# Patient Record
Sex: Female | Born: 1960 | Race: Black or African American | Hispanic: No | Marital: Married | Smoking: Never smoker
Health system: Southern US, Community
[De-identification: ages and names within clinical notes are randomized; demographics above are authoritative.]

## PROBLEM LIST (undated history)

## (undated) DIAGNOSIS — I10 Essential (primary) hypertension: Secondary | ICD-10-CM

## (undated) HISTORY — PX: HERNIA REPAIR: SHX51

---

## 2013-07-03 HISTORY — PX: TOE SURGERY: SHX1073

## 2014-12-18 ENCOUNTER — Other Ambulatory Visit: Payer: Self-pay

## 2014-12-18 DIAGNOSIS — Z1231 Encounter for screening mammogram for malignant neoplasm of breast: Secondary | ICD-10-CM

## 2014-12-24 ENCOUNTER — Ambulatory Visit
Admission: RE | Admit: 2014-12-24 | Discharge: 2014-12-24 | Disposition: A | Discharge status: Home / Self Care | Payer: BC Managed Care – PPO | Source: Ambulatory Visit

## 2014-12-24 ENCOUNTER — Encounter (INDEPENDENT_AMBULATORY_CARE_PROVIDER_SITE_OTHER): Payer: Self-pay

## 2014-12-24 DIAGNOSIS — Z1231 Encounter for screening mammogram for malignant neoplasm of breast: Secondary | ICD-10-CM

## 2015-12-20 ENCOUNTER — Other Ambulatory Visit: Payer: Self-pay | Admitting: Physician Assistant

## 2015-12-20 DIAGNOSIS — Z1231 Encounter for screening mammogram for malignant neoplasm of breast: Secondary | ICD-10-CM

## 2016-01-05 ENCOUNTER — Ambulatory Visit
Admission: RE | Admit: 2016-01-05 | Discharge: 2016-01-05 | Disposition: A | Discharge status: Home / Self Care | Payer: BC Managed Care – PPO | Source: Ambulatory Visit | Attending: Physician Assistant | Admitting: Physician Assistant

## 2016-01-05 DIAGNOSIS — Z1231 Encounter for screening mammogram for malignant neoplasm of breast: Secondary | ICD-10-CM

## 2016-06-23 ENCOUNTER — Other Ambulatory Visit: Payer: Self-pay | Admitting: Physician Assistant

## 2016-06-23 ENCOUNTER — Ambulatory Visit
Admission: RE | Admit: 2016-06-23 | Discharge: 2016-06-23 | Disposition: A | Discharge status: Home / Self Care | Payer: BC Managed Care – PPO | Source: Ambulatory Visit | Attending: Physician Assistant | Admitting: Physician Assistant

## 2016-06-23 DIAGNOSIS — M25561 Pain in right knee: Secondary | ICD-10-CM

## 2016-10-24 ENCOUNTER — Other Ambulatory Visit: Payer: Self-pay | Admitting: Physical Medicine and Rehabilitation

## 2016-10-24 ENCOUNTER — Ambulatory Visit
Admission: RE | Admit: 2016-10-24 | Discharge: 2016-10-24 | Disposition: A | Discharge status: Home / Self Care | Payer: BC Managed Care – PPO | Source: Ambulatory Visit | Attending: Physical Medicine and Rehabilitation | Admitting: Physical Medicine and Rehabilitation

## 2016-10-24 DIAGNOSIS — M25562 Pain in left knee: Secondary | ICD-10-CM

## 2017-01-15 ENCOUNTER — Other Ambulatory Visit: Payer: Self-pay | Admitting: Physician Assistant

## 2017-01-15 DIAGNOSIS — Z1231 Encounter for screening mammogram for malignant neoplasm of breast: Secondary | ICD-10-CM

## 2017-01-17 ENCOUNTER — Ambulatory Visit
Admission: RE | Admit: 2017-01-17 | Discharge: 2017-01-17 | Disposition: A | Discharge status: Home / Self Care | Payer: BC Managed Care – PPO | Source: Ambulatory Visit | Attending: Physician Assistant | Admitting: Physician Assistant

## 2017-01-17 DIAGNOSIS — Z1231 Encounter for screening mammogram for malignant neoplasm of breast: Secondary | ICD-10-CM

## 2017-07-27 ENCOUNTER — Other Ambulatory Visit (HOSPITAL_COMMUNITY)
Admission: RE | Admit: 2017-07-27 | Discharge: 2017-07-27 | Disposition: A | Discharge status: Home / Self Care | Payer: BC Managed Care – PPO | Source: Ambulatory Visit | Attending: Family Medicine | Admitting: Family Medicine

## 2017-07-27 ENCOUNTER — Other Ambulatory Visit: Payer: Self-pay | Admitting: Physician Assistant

## 2017-07-27 DIAGNOSIS — Z01419 Encounter for gynecological examination (general) (routine) without abnormal findings: Secondary | ICD-10-CM | POA: Insufficient documentation

## 2017-07-31 LAB — CYTOLOGY - PAP
Adequacy: ABSENT
DIAGNOSIS: NEGATIVE

## 2018-01-28 ENCOUNTER — Other Ambulatory Visit: Payer: Self-pay | Admitting: Physician Assistant

## 2018-01-28 DIAGNOSIS — Z1231 Encounter for screening mammogram for malignant neoplasm of breast: Secondary | ICD-10-CM

## 2018-02-25 ENCOUNTER — Ambulatory Visit
Admission: RE | Admit: 2018-02-25 | Discharge: 2018-02-25 | Disposition: A | Discharge status: Home / Self Care | Payer: BC Managed Care – PPO | Source: Ambulatory Visit | Attending: Physician Assistant | Admitting: Physician Assistant

## 2018-02-25 DIAGNOSIS — Z1231 Encounter for screening mammogram for malignant neoplasm of breast: Secondary | ICD-10-CM

## 2019-03-05 ENCOUNTER — Other Ambulatory Visit: Payer: Self-pay | Admitting: Physician Assistant

## 2019-03-05 DIAGNOSIS — Z1231 Encounter for screening mammogram for malignant neoplasm of breast: Secondary | ICD-10-CM

## 2019-04-22 ENCOUNTER — Other Ambulatory Visit: Payer: Self-pay

## 2019-04-22 ENCOUNTER — Ambulatory Visit
Admission: RE | Admit: 2019-04-22 | Discharge: 2019-04-22 | Disposition: A | Discharge status: Home / Self Care | Payer: BC Managed Care – PPO | Source: Ambulatory Visit | Attending: Physician Assistant | Admitting: Physician Assistant

## 2019-04-22 DIAGNOSIS — Z1231 Encounter for screening mammogram for malignant neoplasm of breast: Secondary | ICD-10-CM

## 2019-07-14 ENCOUNTER — Ambulatory Visit: Payer: BC Managed Care – PPO | Attending: Internal Medicine

## 2019-07-14 DIAGNOSIS — Z20822 Contact with and (suspected) exposure to covid-19: Secondary | ICD-10-CM

## 2019-07-15 LAB — NOVEL CORONAVIRUS, NAA: SARS-CoV-2, NAA: NOT DETECTED

## 2020-04-26 ENCOUNTER — Other Ambulatory Visit: Payer: Self-pay | Admitting: Physician Assistant

## 2020-04-26 DIAGNOSIS — Z0000 Encounter for general adult medical examination without abnormal findings: Secondary | ICD-10-CM

## 2020-05-26 ENCOUNTER — Other Ambulatory Visit: Payer: Self-pay

## 2020-05-26 ENCOUNTER — Ambulatory Visit
Admission: RE | Admit: 2020-05-26 | Discharge: 2020-05-26 | Disposition: A | Discharge status: Home / Self Care | Payer: BC Managed Care – PPO | Source: Ambulatory Visit | Attending: Physician Assistant | Admitting: Physician Assistant

## 2020-05-26 DIAGNOSIS — Z0000 Encounter for general adult medical examination without abnormal findings: Secondary | ICD-10-CM

## 2020-09-21 ENCOUNTER — Other Ambulatory Visit: Payer: Self-pay | Admitting: Physician Assistant

## 2020-09-21 ENCOUNTER — Ambulatory Visit
Admission: RE | Admit: 2020-09-21 | Discharge: 2020-09-21 | Disposition: A | Discharge status: Home / Self Care | Payer: BC Managed Care – PPO | Source: Ambulatory Visit | Attending: Physician Assistant | Admitting: Physician Assistant

## 2020-09-21 DIAGNOSIS — M25561 Pain in right knee: Secondary | ICD-10-CM

## 2020-09-21 DIAGNOSIS — M25562 Pain in left knee: Secondary | ICD-10-CM

## 2020-11-23 ENCOUNTER — Other Ambulatory Visit: Payer: Self-pay | Admitting: Student

## 2020-11-23 DIAGNOSIS — M25561 Pain in right knee: Secondary | ICD-10-CM

## 2020-12-07 ENCOUNTER — Ambulatory Visit
Admission: RE | Admit: 2020-12-07 | Discharge: 2020-12-07 | Disposition: A | Discharge status: Home / Self Care | Payer: BC Managed Care – PPO | Source: Ambulatory Visit | Attending: Student | Admitting: Student

## 2020-12-07 ENCOUNTER — Other Ambulatory Visit: Payer: Self-pay

## 2020-12-07 DIAGNOSIS — M25561 Pain in right knee: Secondary | ICD-10-CM

## 2021-01-13 ENCOUNTER — Other Ambulatory Visit: Payer: Self-pay | Admitting: Internal Medicine

## 2021-01-13 DIAGNOSIS — N950 Postmenopausal bleeding: Secondary | ICD-10-CM

## 2021-01-17 ENCOUNTER — Ambulatory Visit
Admission: RE | Admit: 2021-01-17 | Discharge: 2021-01-17 | Disposition: A | Discharge status: Home / Self Care | Payer: BC Managed Care – PPO | Source: Ambulatory Visit | Attending: Internal Medicine | Admitting: Internal Medicine

## 2021-01-17 DIAGNOSIS — N950 Postmenopausal bleeding: Secondary | ICD-10-CM

## 2022-01-11 IMAGING — US US PELVIS COMPLETE WITH TRANSVAGINAL
1 series · 13 of 25 positions shown · non-contrast
Comparison: None

CLINICAL DATA: Postmenopausal bleeding



[Series 1: us pelvis complete with transvaginal · 0.22mm/px · 13 of 67 slices shown]
[im 1/67]
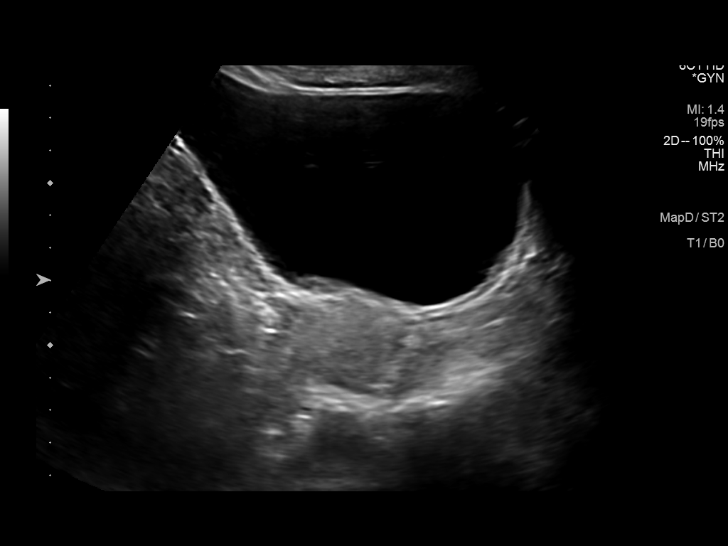
[im 6/67]
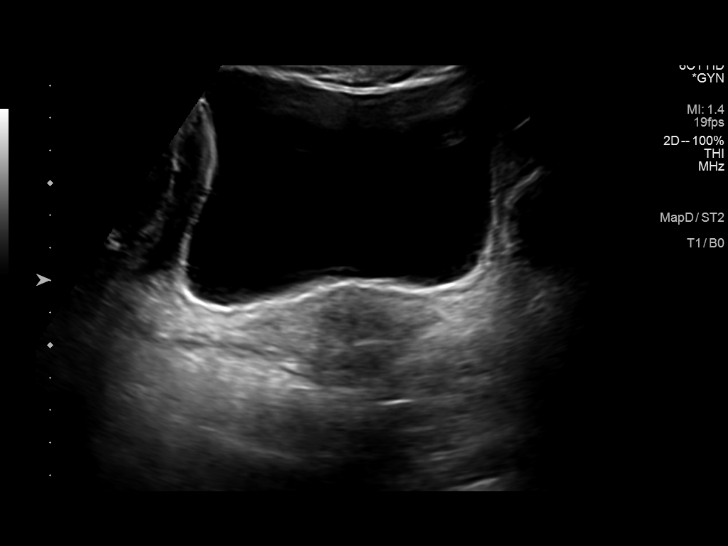
[im 12/67]
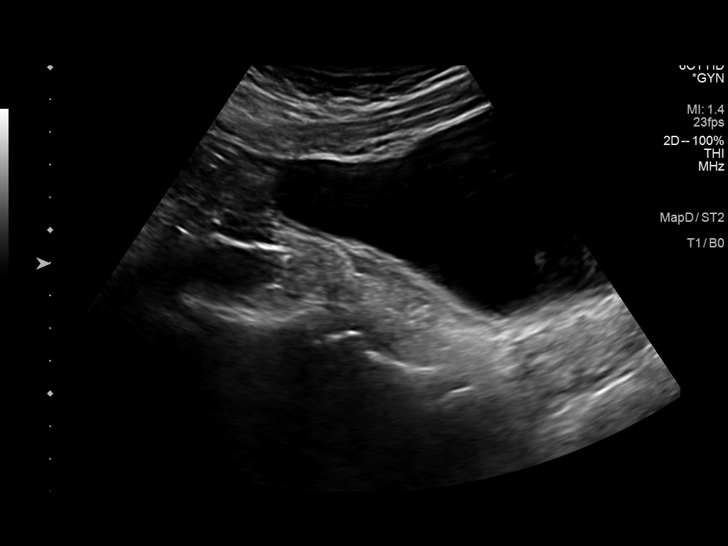
[im 17/67]
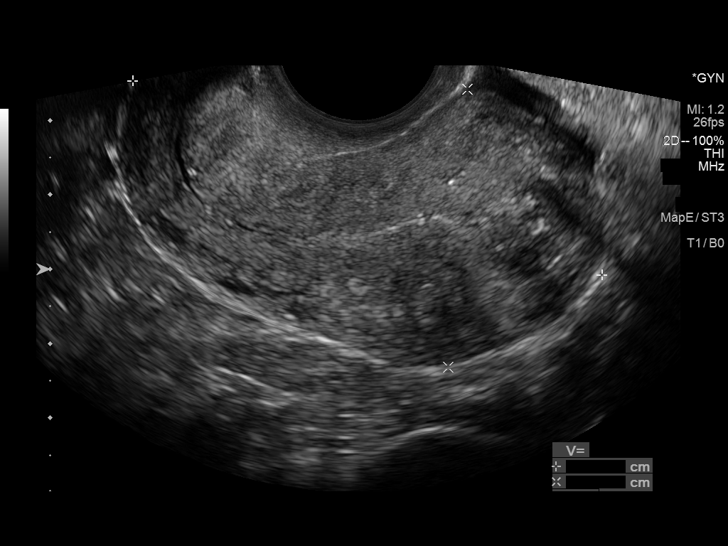
[im 23/67]
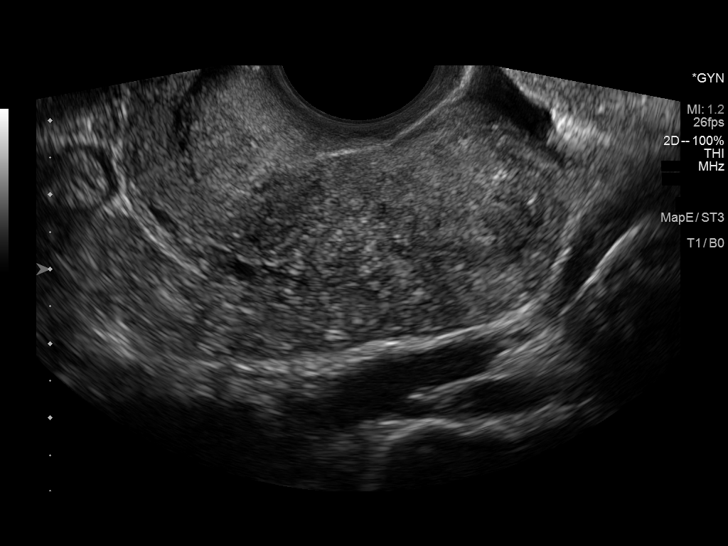
[im 28/67]
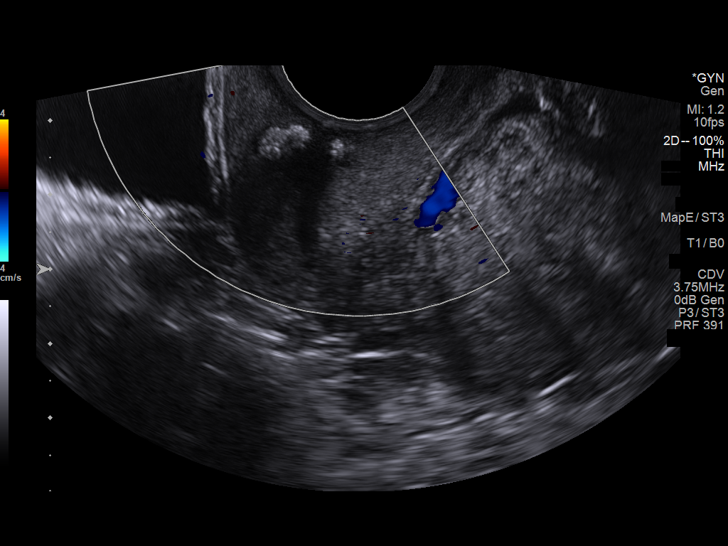
[im 34/67]
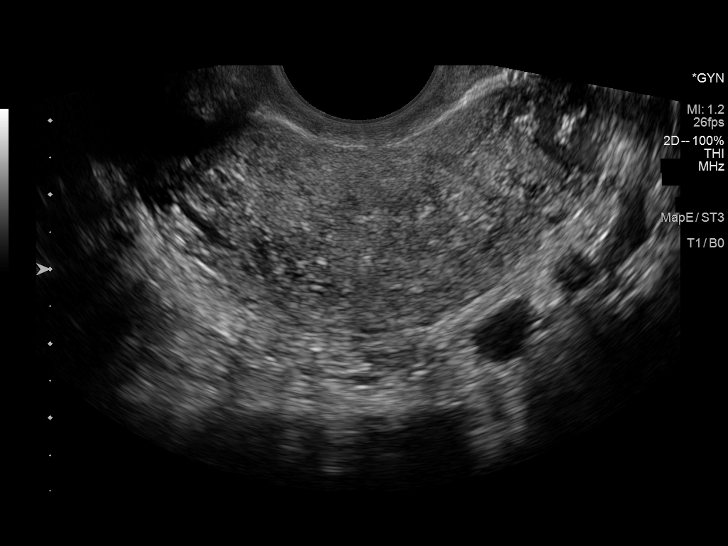
[im 39/67]
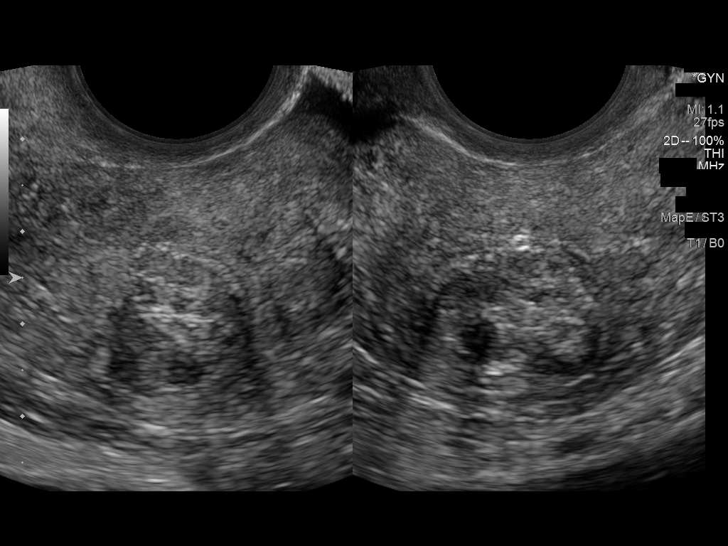
[im 45/67]
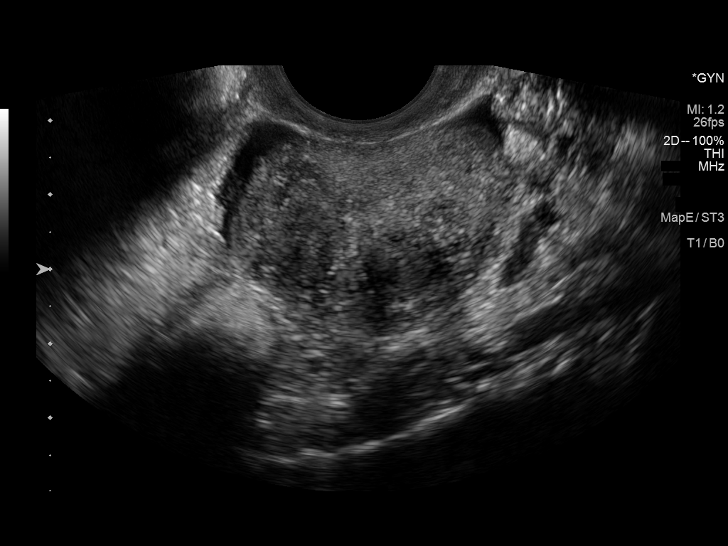
[im 50/67]
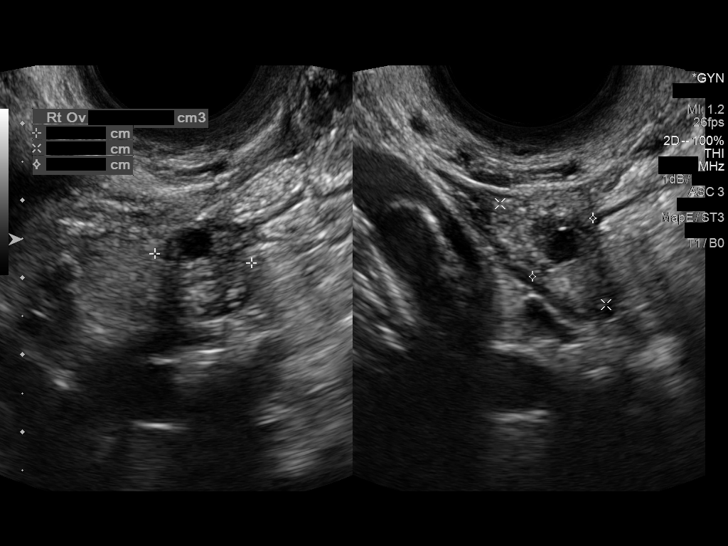
[im 56/67]
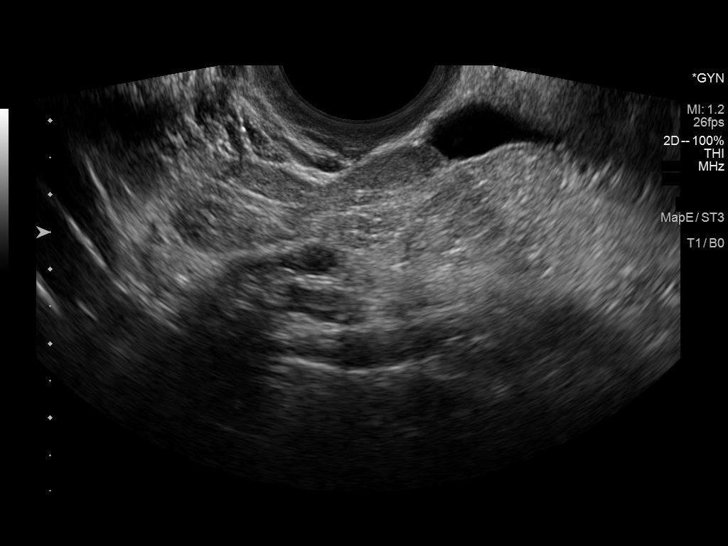
[im 61/67]
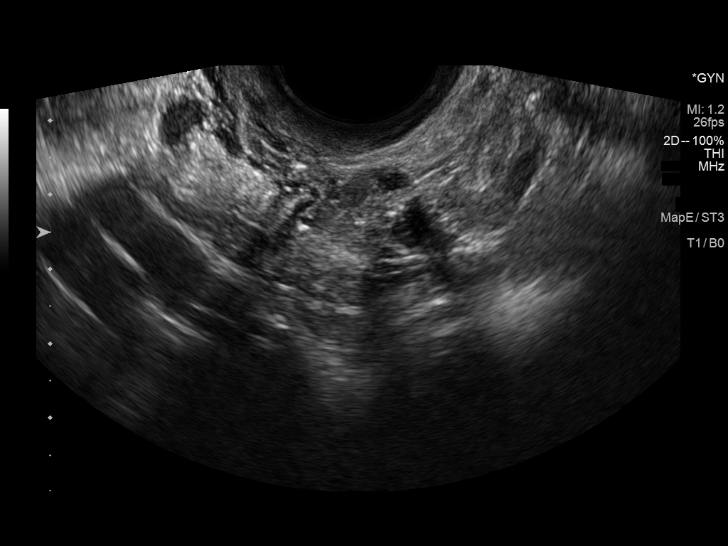
[im 67/67]
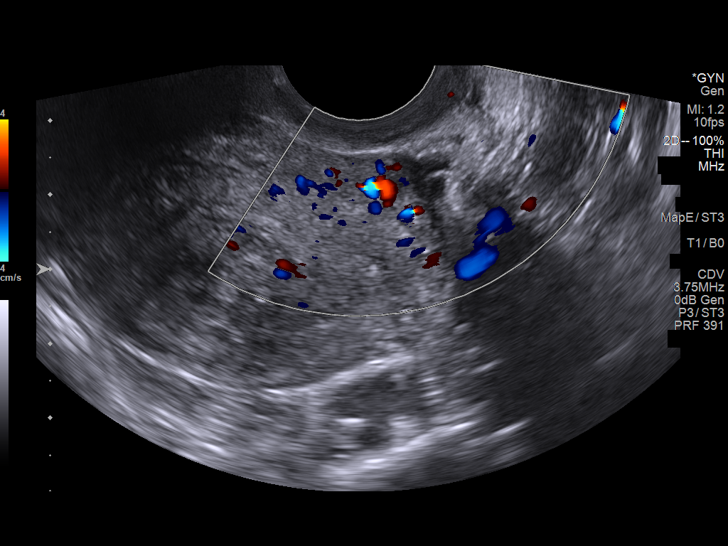

[13 of 25 positions shown; findings below may reference images not displayed]

FINDINGS: Uterus

Measurements: 6.8 x 3.7 x 4.7 cm = volume: 62 mL. Retroverted.
Normal morphology with small intramural leiomyoma at anterior upper
uterus 19 x 15 x 19 mm. Additional small fundal leiomyoma intramural
9 x 11 x 15 mm.

Endometrium

Thickness: 2 mm.  No endometrial fluid or focal abnormality

Right ovary

Measurements: 1.3 x 1.9 x 1.1 cm = volume: 1.4 mL. Normal morphology
without mass

Left ovary

Measurements: 1.8 x 1.4 x 1.1 cm = volume: 1.5 mL. Normal morphology
without mass

Other findings

No free pelvic fluid.  No adnexal masses.
IMPRESSION: Two small uterine leiomyomata.

Otherwise normal exam.

Specifically, endometrial complex normal in appearance, 2 mm thick;
in the setting of post-menopausal bleeding, this is consistent with
a benign etiology such as endometrial atrophy. If bleeding remains
unresponsive to hormonal or medical therapy, sonohysterogram should
be considered for focal lesion work-up. (Ref: Radiological
Reasoning: Algorithmic Workup of Abnormal Vaginal Bleeding with
Endovaginal Sonography and Sonohysterography. AJR [id number]; 191:S68-73)

## 2022-01-17 ENCOUNTER — Other Ambulatory Visit: Payer: Self-pay | Admitting: Physician Assistant

## 2022-01-17 DIAGNOSIS — Z1231 Encounter for screening mammogram for malignant neoplasm of breast: Secondary | ICD-10-CM

## 2022-02-23 ENCOUNTER — Other Ambulatory Visit (HOSPITAL_COMMUNITY): Payer: Self-pay

## 2022-02-23 MED ORDER — SAXENDA 18 MG/3ML ~~LOC~~ SOPN
3.0000 mg | PEN_INJECTOR | Freq: Every day | SUBCUTANEOUS | 1 refills | Status: DC
  Filled 2022-02-23: qty 15, 30d supply, fill #0
  Filled 2022-03-21: qty 15, 30d supply, fill #1

## 2022-02-24 ENCOUNTER — Ambulatory Visit
Admission: RE | Admit: 2022-02-24 | Discharge: 2022-02-24 | Disposition: A | Discharge status: Home / Self Care | Payer: BC Managed Care – PPO | Source: Ambulatory Visit | Attending: Physician Assistant | Admitting: Physician Assistant

## 2022-02-24 ENCOUNTER — Other Ambulatory Visit (HOSPITAL_COMMUNITY): Payer: Self-pay

## 2022-02-24 DIAGNOSIS — Z1231 Encounter for screening mammogram for malignant neoplasm of breast: Secondary | ICD-10-CM

## 2022-03-21 ENCOUNTER — Other Ambulatory Visit (HOSPITAL_COMMUNITY): Payer: Self-pay

## 2022-03-30 ENCOUNTER — Other Ambulatory Visit (HOSPITAL_COMMUNITY): Payer: Self-pay

## 2022-03-30 MED ORDER — INSULIN PEN NEEDLE 32G X 4 MM MISC
Freq: Every day | 0 refills | Status: DC
  Filled 2022-03-30: qty 100, 100d supply, fill #0

## 2022-03-30 MED ORDER — SAXENDA 18 MG/3ML ~~LOC~~ SOPN
3.0000 mg | PEN_INJECTOR | Freq: Every day | SUBCUTANEOUS | 1 refills | Status: DC
  Filled 2022-03-30: qty 15, 30d supply, fill #0

## 2022-04-08 ENCOUNTER — Emergency Department (HOSPITAL_BASED_OUTPATIENT_CLINIC_OR_DEPARTMENT_OTHER)
Admission: EM | Admit: 2022-04-08 | Discharge: 2022-04-08 | Disposition: A | Discharge status: Home / Self Care | Payer: BC Managed Care – PPO | Attending: Emergency Medicine | Admitting: Emergency Medicine

## 2022-04-08 ENCOUNTER — Encounter (HOSPITAL_BASED_OUTPATIENT_CLINIC_OR_DEPARTMENT_OTHER): Payer: Self-pay

## 2022-04-08 ENCOUNTER — Emergency Department (HOSPITAL_BASED_OUTPATIENT_CLINIC_OR_DEPARTMENT_OTHER): Payer: BC Managed Care – PPO

## 2022-04-08 ENCOUNTER — Other Ambulatory Visit: Payer: Self-pay

## 2022-04-08 DIAGNOSIS — Z794 Long term (current) use of insulin: Secondary | ICD-10-CM | POA: Diagnosis not present

## 2022-04-08 DIAGNOSIS — K625 Hemorrhage of anus and rectum: Secondary | ICD-10-CM

## 2022-04-08 DIAGNOSIS — R1030 Lower abdominal pain, unspecified: Secondary | ICD-10-CM

## 2022-04-08 HISTORY — DX: Essential (primary) hypertension: I10

## 2022-04-08 LAB — URINALYSIS, ROUTINE W REFLEX MICROSCOPIC
Bilirubin Urine: NEGATIVE
Glucose, UA: NEGATIVE mg/dL
Ketones, ur: NEGATIVE mg/dL
Leukocytes,Ua: NEGATIVE
Nitrite: NEGATIVE
Specific Gravity, Urine: 1.022 (ref 1.005–1.030)
pH: 6 (ref 5.0–8.0)

## 2022-04-08 LAB — COMPREHENSIVE METABOLIC PANEL
ALT: 16 U/L (ref 0–44)
AST: 11 U/L — ABNORMAL LOW (ref 15–41)
Albumin: 4.4 g/dL (ref 3.5–5.0)
Alkaline Phosphatase: 71 U/L (ref 38–126)
Anion gap: 11 (ref 5–15)
BUN: 14 mg/dL (ref 6–20)
CO2: 27 mmol/L (ref 22–32)
Calcium: 9.9 mg/dL (ref 8.9–10.3)
Chloride: 101 mmol/L (ref 98–111)
Creatinine, Ser: 0.97 mg/dL (ref 0.44–1.00)
GFR, Estimated: >60 mL/min (ref >60)
Glucose, Bld: 80 mg/dL (ref 70–99)
Potassium: 3.4 mmol/L — ABNORMAL LOW (ref 3.5–5.1)
Sodium: 139 mmol/L (ref 135–145)
Total Bilirubin: 1 mg/dL (ref 0.3–1.2)
Total Protein: 8.9 g/dL — ABNORMAL HIGH (ref 6.5–8.1)

## 2022-04-08 LAB — CBC WITH DIFFERENTIAL/PLATELET
Abs Immature Granulocytes: 0.03 10*3/uL (ref 0.00–0.07)
Basophils Absolute: 0 10*3/uL (ref 0.0–0.1)
Basophils Relative: 0 %
Eosinophils Absolute: 0 10*3/uL (ref 0.0–0.5)
Eosinophils Relative: 0 %
HCT: 37.2 % (ref 36.0–46.0)
Hemoglobin: 12.8 g/dL (ref 12.0–15.0)
Immature Granulocytes: 0 %
Lymphocytes Relative: 29 %
Lymphs Abs: 2.6 10*3/uL (ref 0.7–4.0)
MCH: 29.4 pg (ref 26.0–34.0)
MCHC: 34.4 g/dL (ref 30.0–36.0)
MCV: 85.5 fL (ref 80.0–100.0)
Monocytes Absolute: 0.5 10*3/uL (ref 0.1–1.0)
Monocytes Relative: 6 %
Neutro Abs: 5.9 10*3/uL (ref 1.7–7.7)
Neutrophils Relative %: 65 %
Platelets: 328 10*3/uL (ref 150–400)
RBC: 4.35 MIL/uL (ref 3.87–5.11)
RDW: 13.2 % (ref 11.5–15.5)
WBC: 9.1 10*3/uL (ref 4.0–10.5)
nRBC: 0 % (ref 0.0–0.2)

## 2022-04-08 LAB — LIPASE, BLOOD: Lipase: 57 U/L — ABNORMAL HIGH (ref 11–51)

## 2022-04-08 MED ORDER — SODIUM CHLORIDE 0.9 % IV BOLUS
1000.0000 mL | Freq: Once | INTRAVENOUS | Status: AC
  Administered 2022-04-08: 1000 mL via INTRAVENOUS

## 2022-04-08 MED ORDER — HYDROCORTISONE (PERIANAL) 2.5 % EX CREA: 1.0000 | TOPICAL_CREAM | Freq: Two times a day (BID) | CUTANEOUS | 0 refills | Status: DC

## 2022-04-08 MED ORDER — ONDANSETRON HCL 4 MG/2ML IJ SOLN
4.0000 mg | Freq: Once | INTRAMUSCULAR | Status: AC
  Administered 2022-04-08: 4 mg via INTRAVENOUS
  Filled 2022-04-08: qty 2

## 2022-04-08 MED ORDER — ONDANSETRON 4 MG PO TBDP: 4.0000 mg | ORAL_TABLET | Freq: Three times a day (TID) | ORAL | 0 refills | Status: DC | PRN

## 2022-04-08 MED ORDER — MORPHINE SULFATE (PF) 4 MG/ML IV SOLN
4.0000 mg | Freq: Once | INTRAVENOUS | Status: AC
  Administered 2022-04-08: 4 mg via INTRAVENOUS
  Filled 2022-04-08: qty 1

## 2022-04-08 MED ORDER — HYDROCODONE-ACETAMINOPHEN 5-325 MG PO TABS: 1.0000 | ORAL_TABLET | ORAL | 0 refills | Status: DC | PRN

## 2022-04-08 MED ORDER — IOHEXOL 300 MG/ML  SOLN
100.0000 mL | Freq: Once | INTRAMUSCULAR | Status: AC | PRN
  Administered 2022-04-08: 100 mL via INTRAVENOUS

## 2022-04-08 NOTE — ED Provider Notes (Signed)
[place] EMERGENCY DEPT Provider Note   CSN: [id number] Arrival date & time: 04/08/22  1341     History  Chief Complaint  Patient presents with   Abdominal Pain    Meghan Ramirez is a 61 y.o. female.  Pt is a 61 yo female with pmhx significant for htn and arthritis.  Pt said she woke up yesterday morning around 0400 with abd pain.  She felt like she had to have a bowel movement and just blood came out.  Pt has continued having lower abd pain as well as rectal bleeding.  She went to [facility] and was sent here for further eval.  Pt had a colonoscopy a few years ago with Dr. Collene Mares.        Home Medications Prior to Admission medications   Medication Sig Start Date End Date Taking? Authorizing Provider  HYDROcodone-acetaminophen (NORCO/VICODIN) 5-325 MG tablet Take 1 tablet by mouth every 4 (four) hours as needed. 04/08/22  Yes Isla Pence, MD  hydrocortisone (ANUSOL-HC) 2.5 % rectal cream Place 1 Application rectally 2 (two) times daily. 04/08/22  Yes Isla Pence, MD  ondansetron (ZOFRAN-ODT) 4 MG disintegrating tablet Take 1 tablet (4 mg total) by mouth every 8 (eight) hours as needed for nausea or vomiting. 04/08/22  Yes Isla Pence, MD  Insulin Pen Needle 32G X 4 MM MISC Use 1 pen needle to inject Saxenda daily. 03/30/22     Liraglutide -Weight Management (SAXENDA) 18 MG/3ML SOPN Inject 3 mg into the skin daily. 03/30/22         Allergies    Patient has no known allergies.    Review of Systems   Review of Systems  Gastrointestinal:  Positive for abdominal pain and anal bleeding.  All other systems reviewed and are negative.   Physical Exam Updated Vital Signs BP (!) 149/86 (BP Location: Right Arm)   Pulse 76   Temp 98.2 F (36.8 C) (Oral)   Resp 18   Ht 5\' 4"  (1.626 m)   Wt 88.5 kg   SpO2 97%   BMI 33.47 kg/m  Physical Exam Vitals and nursing note reviewed.  Constitutional:      Appearance: She is well-developed. She is obese.  HENT:      Head: Normocephalic and atraumatic.     Mouth/Throat:     Mouth: Mucous membranes are moist.     Pharynx: Oropharynx is clear.  Eyes:     Extraocular Movements: Extraocular movements intact.     Pupils: Pupils are equal, round, and reactive to light.  Cardiovascular:     Rate and Rhythm: Normal rate and regular rhythm.     Heart sounds: Normal heart sounds.  Pulmonary:     Effort: Pulmonary effort is normal.     Breath sounds: Normal breath sounds.  Abdominal:     General: Abdomen is flat and protuberant. Bowel sounds are normal.     Palpations: Abdomen is soft.     Tenderness: There is abdominal tenderness in the right lower quadrant and left lower quadrant.  Skin:    General: Skin is warm.     Capillary Refill: Capillary refill takes less than 2 seconds.  Neurological:     General: No focal deficit present.     Mental Status: She is alert and oriented to person, place, and time.  Psychiatric:        Mood and Affect: Mood normal.        Behavior: Behavior normal.     ED Results /  Procedures / Treatments   Labs (all labs ordered are listed, but only abnormal results are displayed) Labs Reviewed  COMPREHENSIVE METABOLIC PANEL - Abnormal; Notable for the following components:      Result Value   Potassium 3.4 (*)    Total Protein 8.9 (*)    AST 11 (*)    All other components within normal limits  LIPASE, BLOOD - Abnormal; Notable for the following components:   Lipase 57 (*)    All other components within normal limits  URINALYSIS, ROUTINE W REFLEX MICROSCOPIC - Abnormal; Notable for the following components:   Hgb urine dipstick TRACE (*)    Protein, ur TRACE (*)    All other components within normal limits  CBC WITH DIFFERENTIAL/PLATELET    EKG None  Radiology CT ABDOMEN PELVIS W CONTRAST  Result Date: 04/08/2022 CLINICAL DATA:  Abdominal pain. EXAM: CT ABDOMEN AND PELVIS WITH CONTRAST TECHNIQUE: Multidetector CT imaging of the abdomen and pelvis was performed  using the standard protocol following bolus administration of intravenous contrast. RADIATION DOSE REDUCTION: This exam was performed according to the departmental dose-optimization program which includes automated exposure control, adjustment of the mA and/or kV according to patient size and/or use of iterative reconstruction technique. CONTRAST:  OMNIPAQUE IOHEXOL 300 MG/ML  SOLN COMPARISON:  None Available. FINDINGS: Lower chest: Clear lung bases. Hepatobiliary: No focal liver abnormality is seen. No gallstones, gallbladder wall thickening, or biliary dilatation. Pancreas: Unremarkable. No pancreatic ductal dilatation or surrounding inflammatory changes. Spleen: Normal size. Small, 9 mm, cyst, inferior margin. No other masses or lesions. Adrenals/Urinary Tract: Normal adrenal glands. Kidneys normal in size, orientation position with symmetric enhancement and excretion. Left upper pole cyst, 1.2 cm. Right midpole cyst, 1 cm. No follow-up recommended. No other renal masses, no stones and no hydronephrosis. Normal ureters. Normal bladder. Stomach/Bowel: Normal stomach. Small bowel and colon are normal in caliber. No wall thickening. No inflammation. No evidence of appendicitis. Vascular/Lymphatic: No significant vascular findings are present. No enlarged abdominal or pelvic lymph nodes. Reproductive: Uterus and bilateral adnexa are unremarkable. Other: Small fat containing paraumbilical hernia.  No ascites. Musculoskeletal: No fracture or acute finding. No bone lesion. Disc and facet degenerative changes at L4-L5 with a grade 1 anterolisthesis. IMPRESSION: 1. No acute findings within the abdomen or pelvis. No findings to account for abdominal pain. Electronically Signed   By: Amie Portland M.D.   On: 04/08/2022 18:35    Procedures Procedures    Medications Ordered in ED Medications  sodium chloride 0.9 % bolus 1,000 mL (1,000 mLs Intravenous New Bag/Given 04/08/22 1759)  morphine (PF) 4 MG/ML injection  4 mg (4 mg Intravenous Given 04/08/22 1805)  ondansetron (ZOFRAN) injection 4 mg (4 mg Intravenous Given 04/08/22 1805)  iohexol (OMNIPAQUE) 300 MG/ML solution 100 mL (100 mLs Intravenous Contrast Given 04/08/22 1809)    ED Course/ Medical Decision Making/ A&P                           Medical Decision Making Amount and/or Complexity of Data Reviewed Labs: ordered. Radiology: ordered.  Risk Prescription drug management.   This patient presents to the ED for concern of abd pain, this involves an extensive number of treatment options, and is a complaint that carries with it a high risk of complications and morbidity.  The differential diagnosis includes diverticulitis, appendicitis, gastritis, diverticulosis, uti   Co morbidities that complicate the patient evaluation  Htn, OA   Additional history  obtained:  Additional history obtained from epic chart review   Lab Tests:  I Ordered, and personally interpreted labs.  The pertinent results include:  cbc nl, cmp nl, lip nl; ua with some trace blood (likely rectal source)   Imaging Studies ordered:  I ordered imaging studies including ct abd/pelvis  I independently visualized and interpreted imaging which showed  IMPRESSION:  1. No acute findings within the abdomen or pelvis. No findings to  account for abdominal pain.   I agree with the radiologist interpretation   Cardiac Monitoring:  The patient was maintained on a cardiac monitor.  I personally viewed and interpreted the cardiac monitored which showed an underlying rhythm of: nsr   Medicines ordered and prescription drug management:  I ordered medication including morphine and zofran  for pain/nausea  Reevaluation of the patient after these medicines showed that the patient improved I have reviewed the patients home medicines and have made adjustments as needed   Test Considered:  ct   Critical Interventions:  Pain control     Problem List / ED  Course:  Abd pain with Rectal Bleeding:  CT nl.  Labs nl.  Vitals nl.  Rectal bleeding likely from internal hemorrhoid.  Pt is d/c with anusol and instructed to f/u with Dr. Loreta Ave.  Pt knows to return if worse.    Reevaluation:  After the interventions noted above, I reevaluated the patient and found that they have :improved   Social Determinants of Health:  Lives at home   Dispostion:  After consideration of the diagnostic results and the patients response to treatment, I feel that the patent would benefit from discharge with outpatient f/u.          Final Clinical Impression(s) / ED Diagnoses Final diagnoses:  Lower abdominal pain  Rectal bleeding    Rx / DC Orders ED Discharge Orders          Ordered    hydrocortisone (ANUSOL-HC) 2.5 % rectal cream  2 times daily        04/08/22 1908    HYDROcodone-acetaminophen (NORCO/VICODIN) 5-325 MG tablet  Every 4 hours PRN        04/08/22 1908    ondansetron (ZOFRAN-ODT) 4 MG disintegrating tablet  Every 8 hours PRN        04/08/22 1908              Jacalyn Lefevre, MD 04/08/22 1909

## 2022-04-08 NOTE — ED Triage Notes (Signed)
Pt arrives POV from home with c/o of lower abdominal pain, described as severe cramping.  Pain started yesterday around 4:00 am.  This morning she noted blood in toilet when she had a bowel movement.  She was seen at [facility] walk in clinic this am, rectal exam and abdominal x-rays performed, no source of bleeding found.  Pt was sent to ER for further evaluation/CT scan.  Pt ambulatory to triage, in NAD.

## 2022-05-30 ENCOUNTER — Other Ambulatory Visit (HOSPITAL_COMMUNITY): Payer: Self-pay

## 2022-11-09 ENCOUNTER — Other Ambulatory Visit (HOSPITAL_COMMUNITY): Payer: Self-pay

## 2023-03-27 ENCOUNTER — Other Ambulatory Visit: Payer: Self-pay | Admitting: Physician Assistant

## 2023-03-27 DIAGNOSIS — Z1231 Encounter for screening mammogram for malignant neoplasm of breast: Secondary | ICD-10-CM

## 2023-03-28 ENCOUNTER — Ambulatory Visit: Payer: BC Managed Care – PPO

## 2023-05-17 ENCOUNTER — Ambulatory Visit
Admission: RE | Admit: 2023-05-17 | Discharge: 2023-05-17 | Disposition: A | Discharge status: Home / Self Care | Payer: BC Managed Care – PPO | Source: Ambulatory Visit | Attending: Physician Assistant | Admitting: Physician Assistant

## 2023-05-17 DIAGNOSIS — Z1231 Encounter for screening mammogram for malignant neoplasm of breast: Secondary | ICD-10-CM

## 2023-05-18 ENCOUNTER — Other Ambulatory Visit: Payer: Self-pay | Admitting: Physician Assistant

## 2023-05-18 DIAGNOSIS — N644 Mastodynia: Secondary | ICD-10-CM

## 2023-05-18 DIAGNOSIS — N6452 Nipple discharge: Secondary | ICD-10-CM

## 2023-06-06 ENCOUNTER — Other Ambulatory Visit: Payer: Self-pay | Admitting: Physician Assistant

## 2023-06-06 ENCOUNTER — Ambulatory Visit
Admission: RE | Admit: 2023-06-06 | Discharge: 2023-06-06 | Disposition: A | Discharge status: Home / Self Care | Payer: BC Managed Care – PPO | Source: Ambulatory Visit | Attending: Physician Assistant | Admitting: Physician Assistant

## 2023-06-06 DIAGNOSIS — N6452 Nipple discharge: Secondary | ICD-10-CM

## 2023-06-06 DIAGNOSIS — N644 Mastodynia: Secondary | ICD-10-CM

## 2023-06-06 DIAGNOSIS — N63 Unspecified lump in unspecified breast: Secondary | ICD-10-CM

## 2023-06-07 ENCOUNTER — Ambulatory Visit
Admission: RE | Admit: 2023-06-07 | Discharge: 2023-06-07 | Disposition: A | Discharge status: Home / Self Care | Payer: BC Managed Care – PPO | Source: Ambulatory Visit | Attending: Physician Assistant | Admitting: Physician Assistant

## 2023-06-07 DIAGNOSIS — N63 Unspecified lump in unspecified breast: Secondary | ICD-10-CM

## 2023-06-07 DIAGNOSIS — N6452 Nipple discharge: Secondary | ICD-10-CM

## 2023-06-13 ENCOUNTER — Other Ambulatory Visit: Payer: Self-pay | Admitting: Physician Assistant

## 2023-06-13 DIAGNOSIS — N6452 Nipple discharge: Secondary | ICD-10-CM

## 2023-06-14 ENCOUNTER — Ambulatory Visit
Admission: RE | Admit: 2023-06-14 | Discharge: 2023-06-14 | Disposition: A | Discharge status: Home / Self Care | Payer: BC Managed Care – PPO | Source: Ambulatory Visit | Attending: Physician Assistant | Admitting: Physician Assistant

## 2023-06-14 DIAGNOSIS — N6452 Nipple discharge: Secondary | ICD-10-CM

## 2023-06-14 MED ORDER — GADOPICLENOL 0.5 MMOL/ML IV SOLN
8.0000 mL | Freq: Once | INTRAVENOUS | Status: AC | PRN
  Administered 2023-06-14: 8 mL via INTRAVENOUS

## 2023-07-02 ENCOUNTER — Emergency Department (HOSPITAL_BASED_OUTPATIENT_CLINIC_OR_DEPARTMENT_OTHER): Payer: BC Managed Care – PPO | Admitting: Radiology

## 2023-07-02 ENCOUNTER — Ambulatory Visit
Admission: EM | Admit: 2023-07-02 | Discharge: 2023-07-02 | Disposition: A | Discharge status: Home / Self Care | Payer: BC Managed Care – PPO | Attending: Family Medicine | Admitting: Family Medicine

## 2023-07-02 ENCOUNTER — Emergency Department (HOSPITAL_BASED_OUTPATIENT_CLINIC_OR_DEPARTMENT_OTHER)
Admission: EM | Admit: 2023-07-02 | Discharge: 2023-07-02 | Disposition: A | Discharge status: Home / Self Care | Payer: BC Managed Care – PPO | Attending: Emergency Medicine | Admitting: Emergency Medicine

## 2023-07-02 ENCOUNTER — Encounter (HOSPITAL_BASED_OUTPATIENT_CLINIC_OR_DEPARTMENT_OTHER): Payer: Self-pay | Admitting: Emergency Medicine

## 2023-07-02 ENCOUNTER — Other Ambulatory Visit: Payer: Self-pay

## 2023-07-02 DIAGNOSIS — I10 Essential (primary) hypertension: Secondary | ICD-10-CM | POA: Diagnosis not present

## 2023-07-02 DIAGNOSIS — R0789 Other chest pain: Secondary | ICD-10-CM | POA: Diagnosis not present

## 2023-07-02 DIAGNOSIS — R0602 Shortness of breath: Secondary | ICD-10-CM | POA: Insufficient documentation

## 2023-07-02 DIAGNOSIS — Z79899 Other long term (current) drug therapy: Secondary | ICD-10-CM | POA: Insufficient documentation

## 2023-07-02 DIAGNOSIS — R079 Chest pain, unspecified: Secondary | ICD-10-CM | POA: Insufficient documentation

## 2023-07-02 DIAGNOSIS — I160 Hypertensive urgency: Secondary | ICD-10-CM

## 2023-07-02 LAB — BASIC METABOLIC PANEL
Anion gap: 12 (ref 5–15)
BUN: 19 mg/dL (ref 6–20)
CO2: 24 mmol/L (ref 22–32)
Calcium: 9.7 mg/dL (ref 8.9–10.3)
Chloride: 102 mmol/L (ref 98–111)
Creatinine, Ser: 0.77 mg/dL (ref 0.44–1.00)
GFR, Estimated: >60 mL/min (ref >60)
Glucose, Bld: 95 mg/dL (ref 70–99)
Potassium: 3.3 mmol/L — ABNORMAL LOW (ref 3.5–5.1)
Sodium: 138 mmol/L (ref 135–145)

## 2023-07-02 LAB — CBC
HCT: 35.2 % — ABNORMAL LOW (ref 36.0–46.0)
Hemoglobin: 12.4 g/dL (ref 12.0–15.0)
MCH: 29.9 pg (ref 26.0–34.0)
MCHC: 35.2 g/dL (ref 30.0–36.0)
MCV: 84.8 fL (ref 80.0–100.0)
Platelets: 326 10*3/uL (ref 150–400)
RBC: 4.15 MIL/uL (ref 3.87–5.11)
RDW: 13.8 % (ref 11.5–15.5)
WBC: 9.2 10*3/uL (ref 4.0–10.5)
nRBC: 0 % (ref 0.0–0.2)

## 2023-07-02 LAB — TROPONIN I (HIGH SENSITIVITY)
Troponin I (High Sensitivity): 3 ng/L (ref <18)
Troponin I (High Sensitivity): 3 ng/L (ref <18)

## 2023-07-02 LAB — D-DIMER, QUANTITATIVE: D-Dimer, Quant: 0.33 ug{FEU}/mL (ref 0.00–0.50)

## 2023-07-02 MED ORDER — POTASSIUM CHLORIDE CRYS ER 20 MEQ PO TBCR
40.0000 meq | EXTENDED_RELEASE_TABLET | Freq: Once | ORAL | Status: AC
  Administered 2023-07-02: 40 meq via ORAL
  Filled 2023-07-02: qty 2

## 2023-07-02 MED ORDER — ASPIRIN 81 MG PO CHEW
324.0000 mg | CHEWABLE_TABLET | Freq: Once | ORAL | Status: AC
  Administered 2023-07-02: 324 mg via ORAL
  Filled 2023-07-02: qty 4

## 2023-07-02 NOTE — ED Notes (Signed)
Patient is being discharged from the Urgent Care and sent to the Emergency Department via POV . Per Urban Gibson, PA-C, patient is in need of higher level of care due to CP/SHOB. Patient is aware and verbalizes understanding of plan of care.  Vitals:   07/02/23 1440  BP: (!) 174/108  Pulse: 71  Resp: 15  Temp: 98.4 F (36.9 C)  SpO2: 95%

## 2023-07-02 NOTE — ED Triage Notes (Signed)
Pt c/o sharp central CP started last night-SHOB started today-denies flu like sx-pain to right shoulder/RE and upper/mid back x 2-3 weeks was seen by PCP for those pains-NAD-steady gait

## 2023-07-02 NOTE — ED Provider Notes (Signed)
[place] EMERGENCY DEPARTMENT AT [facility] Provider Note   CSN: [id number] Arrival date & time: 07/02/23  1528     History  Chief Complaint  Patient presents with   Chest Pain    Meghan Ramirez is a 62 y.o. female.  HPI 62 year old female with a history of hypertension and hyperlipidemia presents with chest pain.  Last night developed some sharp chest pain to her right inferior and midline chest.  Does not radiate.  Does feel little short of breath.  When she woke up this morning it was not there but then has come and gone throughout the day.  Nothing specific exacerbates it.  Sometimes moving seems to make it worse but not a certain position.  No significant cough.  No leg swelling or recent travel.  No abdominal pain.  Went to urgent care where they did an EKG and sent her here.  Home Medications Prior to Admission medications   Medication Sig Start Date End Date Taking? Authorizing Provider  atorvastatin (LIPITOR) 10 MG tablet 1 tablet Orally Once a day    [provider]  hydroCHLOROthiazide (MICROZIDE PO) hydrochlorothiazide    [provider]  HYDROcodone-acetaminophen (NORCO/VICODIN) 5-325 MG tablet Take 1 tablet by mouth every 4 (four) hours as needed. 04/08/22   Jacalyn Lefevre, MD  hydrocortisone (ANUSOL-HC) 2.5 % rectal cream Place 1 Application rectally 2 (two) times daily. 04/08/22   Jacalyn Lefevre, MD  Insulin Pen Needle 32G X 4 MM MISC Use 1 pen needle to inject Saxenda daily. 03/30/22     Liraglutide -Weight Management (SAXENDA) 18 MG/3ML SOPN Inject 3 mg into the skin daily. 03/30/22     ondansetron (ZOFRAN-ODT) 4 MG disintegrating tablet Take 1 tablet (4 mg total) by mouth every 8 (eight) hours as needed for nausea or vomiting. 04/08/22   Jacalyn Lefevre, MD  valsartan-hydrochlorothiazide (DIOVAN-HCT) 320-25 MG tablet 1 tablet Orally Once a day    [provider]  verapamil (VERELAN) 360 MG 24 hr capsule 1 capsule Orally Once a day  for 90 days    [provider]      Allergies    Patient has no known allergies.    Review of Systems   Review of Systems  Respiratory:  Positive for shortness of breath.   Cardiovascular:  Positive for chest pain. Negative for leg swelling.  Gastrointestinal:  Negative for abdominal pain.    Physical Exam Updated Vital Signs BP (!) 162/94   Pulse 69   Temp 98.2 F (36.8 C) (Oral)   Resp 14   SpO2 99%  Physical Exam Vitals and nursing note reviewed.  Constitutional:      Appearance: She is well-developed.  HENT:     Head: Normocephalic and atraumatic.  Cardiovascular:     Rate and Rhythm: Normal rate and regular rhythm.     Heart sounds: Normal heart sounds.  Pulmonary:     Effort: Pulmonary effort is normal.     Breath sounds: Normal breath sounds.  Chest:     Chest wall: No tenderness.  Abdominal:     Palpations: Abdomen is soft.     Tenderness: There is no abdominal tenderness.  Skin:    General: Skin is warm and dry.  Neurological:     Mental Status: She is alert.     ED Results / Procedures / Treatments   Labs (all labs ordered are listed, but only abnormal results are displayed) Labs Reviewed  BASIC METABOLIC PANEL - Abnormal; Notable for the  following components:      Result Value   Potassium 3.3 (*)    All other components within normal limits  CBC - Abnormal; Notable for the following components:   HCT 35.2 (*)    All other components within normal limits  D-DIMER, QUANTITATIVE  TROPONIN I (HIGH SENSITIVITY)  TROPONIN I (HIGH SENSITIVITY)    EKG EKG Interpretation Date/Time:  Monday July 02 2023 15:47:07 EST Ventricular Rate:  71 PR Interval:  188 QRS Duration:  80 QT Interval:  410 QTC Calculation: 445 R Axis:   53  Text Interpretation: Sinus rhythm with marked sinus arrhythmia  diffuse nonspecific changes Confirmed by Pricilla Loveless [phone]) on 07/02/2023 4:18:43 PM  Radiology DG Chest 2 View Result Date:  07/02/2023 CLINICAL DATA:  Chest pain. EXAM: CHEST - 2 VIEW COMPARISON:  None Available. FINDINGS: The heart size and mediastinal contours are within normal limits. No focal consolidation, pleural effusion, or pneumothorax. No acute osseous abnormality. IMPRESSION: No acute cardiopulmonary findings. Electronically Signed   By: Hart Robinsons M.D.   On: 07/02/2023 18:26    Procedures Procedures    Medications Ordered in ED Medications  potassium chloride SA (KLOR-CON M) CR tablet 40 mEq (40 mEq Oral Given 07/02/23 1954)  aspirin chewable tablet 324 mg (324 mg Oral Given 07/02/23 2030)    ED Course/ Medical Decision Making/ A&P                                 Medical Decision Making Amount and/or Complexity of Data Reviewed External Data Reviewed: notes. Labs: ordered.    Details: Normal troponins x 2.  Normal D-dimer Radiology: ordered and independent interpretation performed.    Details: No CHF ECG/medicine tests: ordered and independent interpretation performed.    Details: Nonspecific changes, similar to urgent care EKG, which patient brought with her  Risk OTC drugs. Prescription drug management.   Patient chest pain is pretty atypical.  D-dimer is negative, otherwise have low suspicion for PE so I think no further workup needed.  Troponins are negative x 2 and she has been having chest pain on and off all day.  Potassium was repleted, mildly low here.  I doubt ACS, PE, dissection and I think she is stable for outpatient follow-up with her PCP.  She is hypertensive here and will need this rechecked with her PCP as well.  She does feel better with some aspirin.  Will discharge home with return precautions.        Final Clinical Impression(s) / ED Diagnoses Final diagnoses:  Nonspecific chest pain    Rx / DC Orders ED Discharge Orders     None         Pricilla Loveless, MD 07/02/23 2209

## 2023-07-02 NOTE — ED Provider Triage Note (Signed)
Emergency Medicine Provider Triage Evaluation Note  Meghan Ramirez , a 62 y.o. female  was evaluated in triage.  Pt complains of chest pain starting today  Review of Systems  Positive: Pain  Negative: No fever no chills  Physical Exam  BP (!) 164/92 (BP Location: Right Arm)   Pulse 68   Temp (!) 97.5 F (36.4 C)   Resp 18   SpO2 97%  Gen:   Awake, no distress   Resp:  Normal effort  MSK:   Moves extremities without difficulty  Other:    Medical Decision Making  Medically screening exam initiated at 4:12 PM.  Appropriate orders placed.  Meghan Ramirez was informed that the remainder of the evaluation will be completed by another provider, this initial triage assessment does not replace that evaluation, and the importance of remaining in the ED until their evaluation is complete.     Elson Areas, [place] 07/02/23 1610

## 2023-07-02 NOTE — Discharge Instructions (Signed)
If you develop recurrent, continued, or worsening chest pain, shortness of breath, fever, vomiting, abdominal or back pain, or any other new/concerning symptoms then return to the ER for evaluation.  

## 2023-07-02 NOTE — ED Provider Notes (Signed)
[facility] - URGENT CARE CENTER  Note:  This document was prepared using Conservation officer, historic buildings and may include unintentional dictation errors.  MRN: [id number] DOB: 06/14/1961  Subjective:   Meghan Ramirez is a 62 y.o. female presenting for 1 day history of moderate mid sternal chest pain with associated shortness of breath.  She has also had right shoulder pain, upper back pain.  Has been getting worked up by her PCP for this.  No history of heart disease, MI, diabetes.  No smoking.  No fall, trauma, fever, cough.  No history of asthma.  Patient does have a history of hypertension.  No current facility-administered medications for this encounter.  Current Outpatient Medications:    atorvastatin (LIPITOR) 10 MG tablet, 1 tablet Orally Once a day, Disp: , Rfl:    hydroCHLOROthiazide (MICROZIDE PO), hydrochlorothiazide, Disp: , Rfl:    HYDROcodone-acetaminophen (NORCO/VICODIN) 5-325 MG tablet, Take 1 tablet by mouth every 4 (four) hours as needed., Disp: 10 tablet, Rfl: 0   hydrocortisone (ANUSOL-HC) 2.5 % rectal cream, Place 1 Application rectally 2 (two) times daily., Disp: 30 g, Rfl: 0   Insulin Pen Needle 32G X 4 MM MISC, Use 1 pen needle to inject Saxenda daily., Disp: 100 each, Rfl: 0   Liraglutide -Weight Management (SAXENDA) 18 MG/3ML SOPN, Inject 3 mg into the skin daily., Disp: 15 mL, Rfl: 1   ondansetron (ZOFRAN-ODT) 4 MG disintegrating tablet, Take 1 tablet (4 mg total) by mouth every 8 (eight) hours as needed for nausea or vomiting., Disp: 20 tablet, Rfl: 0   valsartan-hydrochlorothiazide (DIOVAN-HCT) 320-25 MG tablet, 1 tablet Orally Once a day, Disp: , Rfl:    verapamil (VERELAN) 360 MG 24 hr capsule, 1 capsule Orally Once a day for 90 days, Disp: , Rfl:    No Known Allergies  Past Medical History:  Diagnosis Date   Hypertension      Past Surgical History:  Procedure Laterality Date   HERNIA REPAIR     TOE SURGERY Left 2015   left great toe     Family History  Problem Relation Age of Onset   BRCA 1/2 Neg Hx    Breast cancer Neg Hx     Social History   Tobacco Use   Smoking status: Never   Smokeless tobacco: Never  Vaping Use   Vaping status: Never Used  Substance Use Topics   Alcohol use: Yes    Comment: occ   Drug use: Never    ROS   Objective:   Vitals: BP (!) 174/108 (BP Location: Left Arm)   Pulse 71   Temp 98.4 F (36.9 C) (Oral)   Resp 15   SpO2 95%   Physical Exam Constitutional:      General: She is not in acute distress.    Appearance: Normal appearance. She is well-developed. She is not ill-appearing, toxic-appearing or diaphoretic.  HENT:     Head: Normocephalic and atraumatic.     Nose: Nose normal.     Mouth/Throat:     Mouth: Mucous membranes are moist.  Eyes:     General: No scleral icterus.       Right eye: No discharge.        Left eye: No discharge.     Extraocular Movements: Extraocular movements intact.  Cardiovascular:     Rate and Rhythm: Normal rate and regular rhythm.     Heart sounds: Normal heart sounds. No murmur heard.    No friction rub. No gallop.  Pulmonary:  Effort: Pulmonary effort is normal. No respiratory distress.     Breath sounds: No stridor. No wheezing, rhonchi or rales.  Chest:     Chest wall: No tenderness.  Skin:    General: Skin is warm and dry.  Neurological:     General: No focal deficit present.     Mental Status: She is alert and oriented to person, place, and time.  Psychiatric:        Mood and Affect: Mood normal.        Behavior: Behavior normal.     ED ECG REPORT   Date: 07/02/2023  EKG Time: 3:11 PM  Rate: 69bpm  Rhythm: sinus arrhythmia,  there are no previous tracings available for comparison  Axis: left  Intervals:none  ST&T Change: T wave inversion in I, T wave flattening in aVL  Narrative Interpretation: Sinus arrhythmia at 69 bpm with nonspecific T wave changes.  Assessment and Plan :   PDMP not reviewed this  encounter.  1. Atypical chest pain   2. Hypertensive urgency    No active signs of ACS on her EKG.  However, patient warrants further cardiac workup to rule out a cardiopulmonary event given her severely elevated blood pressure, nonreproducible midsternal chest pain and abnormal EKG.  I discussed this with the patient and she is agreeable to present to the emergency room with her husband driving her by personal vehicle.   Wallis Bamberg, [place] 07/02/23 1515

## 2023-07-03 ENCOUNTER — Ambulatory Visit: Payer: Self-pay | Admitting: General Surgery

## 2023-07-10 ENCOUNTER — Other Ambulatory Visit: Payer: Self-pay

## 2023-07-10 ENCOUNTER — Encounter (HOSPITAL_BASED_OUTPATIENT_CLINIC_OR_DEPARTMENT_OTHER): Payer: Self-pay | Admitting: General Surgery

## 2023-07-10 MED ORDER — CHLORHEXIDINE GLUCONATE CLOTH 2 % EX PADS: 6.0000 | MEDICATED_PAD | Freq: Once | CUTANEOUS | Status: DC

## 2023-07-10 NOTE — Progress Notes (Signed)
 Chart reviewed with Dr. Desmond Lope, ok to proceed with planned surgery at [facility].

## 2023-07-10 NOTE — Progress Notes (Signed)

## 2023-07-13 ENCOUNTER — Encounter (HOSPITAL_BASED_OUTPATIENT_CLINIC_OR_DEPARTMENT_OTHER)
Admission: RE | Disposition: A | Discharge status: Home / Self Care | Payer: Self-pay | Source: Home / Self Care | Attending: General Surgery

## 2023-07-13 ENCOUNTER — Ambulatory Visit (HOSPITAL_BASED_OUTPATIENT_CLINIC_OR_DEPARTMENT_OTHER): Payer: 59 | Admitting: Anesthesiology

## 2023-07-13 ENCOUNTER — Other Ambulatory Visit: Payer: Self-pay

## 2023-07-13 ENCOUNTER — Encounter (HOSPITAL_BASED_OUTPATIENT_CLINIC_OR_DEPARTMENT_OTHER): Payer: Self-pay | Admitting: General Surgery

## 2023-07-13 ENCOUNTER — Ambulatory Visit (HOSPITAL_BASED_OUTPATIENT_CLINIC_OR_DEPARTMENT_OTHER)
Admission: RE | Admit: 2023-07-13 | Discharge: 2023-07-13 | Disposition: A | Discharge status: Home / Self Care | Payer: 59 | Attending: General Surgery | Admitting: General Surgery

## 2023-07-13 DIAGNOSIS — I10 Essential (primary) hypertension: Secondary | ICD-10-CM | POA: Insufficient documentation

## 2023-07-13 DIAGNOSIS — N6452 Nipple discharge: Secondary | ICD-10-CM | POA: Diagnosis not present

## 2023-07-13 DIAGNOSIS — D241 Benign neoplasm of right breast: Secondary | ICD-10-CM | POA: Insufficient documentation

## 2023-07-13 DIAGNOSIS — Z01818 Encounter for other preprocedural examination: Secondary | ICD-10-CM

## 2023-07-13 DIAGNOSIS — Z87891 Personal history of nicotine dependence: Secondary | ICD-10-CM | POA: Insufficient documentation

## 2023-07-13 HISTORY — PX: BREAST DUCTAL SYSTEM EXCISION: SHX5242

## 2023-07-13 SURGERY — EXCISION DUCTAL SYSTEM BREAST
Anesthesia: General | Site: Breast | Laterality: Right

## 2023-07-13 MED ORDER — GABAPENTIN 100 MG PO CAPS
ORAL_CAPSULE | ORAL | Status: AC
  Filled 2023-07-13: qty 1

## 2023-07-13 MED ORDER — OXYCODONE HCL 5 MG PO TABS: 5.0000 mg | ORAL_TABLET | Freq: Four times a day (QID) | ORAL | 0 refills | Status: AC | PRN

## 2023-07-13 MED ORDER — BUPIVACAINE-EPINEPHRINE 0.25% -1:200000 IJ SOLN
INTRAMUSCULAR | Status: DC | PRN
  Administered 2023-07-13: 15 mL

## 2023-07-13 MED ORDER — ACETAMINOPHEN 500 MG PO TABS
1000.0000 mg | ORAL_TABLET | ORAL | Status: AC
  Administered 2023-07-13: 1000 mg via ORAL

## 2023-07-13 MED ORDER — MIDAZOLAM HCL 5 MG/5ML IJ SOLN
INTRAMUSCULAR | Status: DC | PRN
  Administered 2023-07-13: 2 mg via INTRAVENOUS

## 2023-07-13 MED ORDER — ONDANSETRON HCL 4 MG/2ML IJ SOLN
INTRAMUSCULAR | Status: AC
  Filled 2023-07-13: qty 2

## 2023-07-13 MED ORDER — OXYCODONE HCL 5 MG PO TABS: 5.0000 mg | ORAL_TABLET | Freq: Once | ORAL | Status: DC | PRN

## 2023-07-13 MED ORDER — CEFAZOLIN SODIUM-DEXTROSE 2-4 GM/100ML-% IV SOLN
2.0000 g | INTRAVENOUS | Status: AC
  Administered 2023-07-13: 2 g via INTRAVENOUS

## 2023-07-13 MED ORDER — GABAPENTIN 100 MG PO CAPS
100.0000 mg | ORAL_CAPSULE | ORAL | Status: AC
  Administered 2023-07-13: 100 mg via ORAL

## 2023-07-13 MED ORDER — DEXAMETHASONE SODIUM PHOSPHATE 10 MG/ML IJ SOLN
INTRAMUSCULAR | Status: DC | PRN
  Administered 2023-07-13: 10 mg via INTRAVENOUS

## 2023-07-13 MED ORDER — OXYCODONE HCL 5 MG/5ML PO SOLN: 5.0000 mg | Freq: Once | ORAL | Status: DC | PRN

## 2023-07-13 MED ORDER — SODIUM CHLORIDE 0.9 % IV SOLN
INTRAVENOUS | Status: DC | PRN
Start: 2023-07-13 — End: 2023-07-13

## 2023-07-13 MED ORDER — EPHEDRINE 5 MG/ML INJ
INTRAVENOUS | Status: AC
  Filled 2023-07-13: qty 5

## 2023-07-13 MED ORDER — MIDAZOLAM HCL 2 MG/2ML IJ SOLN
INTRAMUSCULAR | Status: AC
  Filled 2023-07-13: qty 2

## 2023-07-13 MED ORDER — FENTANYL CITRATE (PF) 100 MCG/2ML IJ SOLN: 25.0000 ug | INTRAMUSCULAR | Status: DC | PRN

## 2023-07-13 MED ORDER — FENTANYL CITRATE (PF) 100 MCG/2ML IJ SOLN
INTRAMUSCULAR | Status: DC | PRN
  Administered 2023-07-13 (×2): 25 ug via INTRAVENOUS
  Administered 2023-07-13: 50 ug via INTRAVENOUS

## 2023-07-13 MED ORDER — PROPOFOL 10 MG/ML IV BOLUS
INTRAVENOUS | Status: DC | PRN
  Administered 2023-07-13: 200 mg via INTRAVENOUS

## 2023-07-13 MED ORDER — LIDOCAINE 2% (20 MG/ML) 5 ML SYRINGE
INTRAMUSCULAR | Status: DC | PRN
  Administered 2023-07-13: 100 mg via INTRAVENOUS

## 2023-07-13 MED ORDER — LACTATED RINGERS IV SOLN: INTRAVENOUS | Status: DC

## 2023-07-13 MED ORDER — PHENYLEPHRINE 80 MCG/ML (10ML) SYRINGE FOR IV PUSH (FOR BLOOD PRESSURE SUPPORT)
PREFILLED_SYRINGE | INTRAVENOUS | Status: DC | PRN
  Administered 2023-07-13 (×2): 160 ug via INTRAVENOUS

## 2023-07-13 MED ORDER — ONDANSETRON HCL 4 MG/2ML IJ SOLN
INTRAMUSCULAR | Status: DC | PRN
  Administered 2023-07-13: 4 mg via INTRAVENOUS

## 2023-07-13 MED ORDER — FENTANYL CITRATE (PF) 100 MCG/2ML IJ SOLN
INTRAMUSCULAR | Status: AC
  Filled 2023-07-13: qty 2

## 2023-07-13 MED ORDER — ONDANSETRON HCL 4 MG/2ML IJ SOLN: 4.0000 mg | Freq: Four times a day (QID) | INTRAMUSCULAR | Status: DC | PRN

## 2023-07-13 MED ORDER — DEXAMETHASONE SODIUM PHOSPHATE 10 MG/ML IJ SOLN
INTRAMUSCULAR | Status: AC
  Filled 2023-07-13: qty 1

## 2023-07-13 MED ORDER — CEFAZOLIN SODIUM-DEXTROSE 2-4 GM/100ML-% IV SOLN
INTRAVENOUS | Status: AC
  Filled 2023-07-13: qty 100

## 2023-07-13 MED ORDER — ACETAMINOPHEN 500 MG PO TABS
ORAL_TABLET | ORAL | Status: AC
  Filled 2023-07-13: qty 2

## 2023-07-13 MED ORDER — EPHEDRINE SULFATE-NACL 50-0.9 MG/10ML-% IV SOSY
PREFILLED_SYRINGE | INTRAVENOUS | Status: DC | PRN
  Administered 2023-07-13: 10 mg via INTRAVENOUS
  Administered 2023-07-13: 5 mg via INTRAVENOUS
  Administered 2023-07-13: 10 mg via INTRAVENOUS

## 2023-07-13 SURGICAL SUPPLY — 35 items
APPLIER CLIP 9.375 MED OPEN (MISCELLANEOUS)
BLADE SURG 15 STRL LF DISP TIS (BLADE) ×1 IMPLANT
CANISTER SUCT 1200ML W/VALVE (MISCELLANEOUS) ×1 IMPLANT
CHLORAPREP W/TINT 26 (MISCELLANEOUS) ×1 IMPLANT
CLIP APPLIE 9.375 MED OPEN (MISCELLANEOUS) IMPLANT
COVER BACK TABLE 60X90IN (DRAPES) ×1 IMPLANT
COVER MAYO STAND STRL (DRAPES) ×1 IMPLANT
DERMABOND ADVANCED .7 DNX12 (GAUZE/BANDAGES/DRESSINGS) ×1 IMPLANT
DRAPE LAPAROSCOPIC ABDOMINAL (DRAPES) ×1 IMPLANT
DRAPE UTILITY XL STRL (DRAPES) ×1 IMPLANT
ELECT BLADE 4.0 EZ CLEAN MEGAD (MISCELLANEOUS)
ELECT COATED BLADE 2.86 ST (ELECTRODE) ×1 IMPLANT
ELECT REM PT RETURN 9FT ADLT (ELECTROSURGICAL) ×1
ELECTRODE BLDE 4.0 EZ CLN MEGD (MISCELLANEOUS) IMPLANT
ELECTRODE REM PT RTRN 9FT ADLT (ELECTROSURGICAL) ×1 IMPLANT
GLOVE BIO SURGEON STRL SZ7.5 (GLOVE) ×1 IMPLANT
GOWN STRL REUS W/ TWL LRG LVL3 (GOWN DISPOSABLE) ×2 IMPLANT
KIT MARKER MARGIN INK (KITS) IMPLANT
NDL HYPO 25X1 1.5 SAFETY (NEEDLE) ×1 IMPLANT
NEEDLE HYPO 25X1 1.5 SAFETY (NEEDLE) ×1
NS IRRIG 1000ML POUR BTL (IV SOLUTION) ×1 IMPLANT
PACK BASIN DAY SURGERY FS (CUSTOM PROCEDURE TRAY) ×1 IMPLANT
PENCIL SMOKE EVACUATOR (MISCELLANEOUS) ×1 IMPLANT
SLEEVE SCD COMPRESS KNEE MED (STOCKING) ×1 IMPLANT
SPIKE FLUID TRANSFER (MISCELLANEOUS) ×1 IMPLANT
SPONGE T-LAP 18X18 ~~LOC~~+RFID (SPONGE) ×1 IMPLANT
SUT MON AB 4-0 PC3 18 (SUTURE) ×1 IMPLANT
SUT SILK 2 0 SH (SUTURE) ×1 IMPLANT
SUT VICRYL 3-0 CR8 SH (SUTURE) ×1 IMPLANT
SYR 10ML LL (SYRINGE) IMPLANT
SYR CONTROL 10ML LL (SYRINGE) ×1 IMPLANT
TOWEL GREEN STERILE FF (TOWEL DISPOSABLE) ×1 IMPLANT
TRAY FAXITRON CT DISP (TRAY / TRAY PROCEDURE) IMPLANT
TUBE CONNECTING 20X1/4 (TUBING) ×1 IMPLANT
YANKAUER SUCT BULB TIP NO VENT (SUCTIONS) ×1 IMPLANT

## 2023-07-13 NOTE — H&P (Signed)
 REFERRING PHYSICIAN: Alvera Sydelle SQUIBB, PA PROVIDER: DEWARD GARNETTE NULL, MD MRN: [id number] DOB: Jul 20, 1960 Subjective   Chief Complaint: New Consultation  History of Present Illness: Meghan Ramirez is a 63 y.o. female who is seen today as an office consultation for evaluation of New Consultation  We are asked to see the patient in consultation by Dr. Marcus Almas to evaluate her for bloody nipple discharge from the right breast. The patient is a 63 year old white female who first noticed bloody nipple discharge from the right breast about a month ago. She brought this to her medical doctor's attention. She was evaluated with mammogram ultrasound and MRI and was found to have a 1 cm cyst in the retroareolar right breast that appeared to have hemorrhagic like fluid. It is possible that this is the source of her nipple discharge. There were no findings worrisome for malignancy. She has no family history of breast cancer. She is otherwise in pretty good health and does not smoke.  Review of Systems: A complete review of systems was obtained from the patient. I have reviewed this information and discussed as appropriate with the patient. See HPI as well for other ROS.  ROS   Medical History: Past Medical History:  Diagnosis Date  Hypertension   Patient Active Problem List  Diagnosis  Bloody discharge from right nipple   Past Surgical History:  Procedure Laterality Date  HERNIA REPAIR    No Known Allergies  Current Outpatient Medications on File Prior to Visit  Medication Sig Dispense Refill  atorvastatin (LIPITOR) 10 MG tablet 1 tablet Orally Once a day  valsartan-hydroCHLOROthiazide (DIOVAN-HCT) 320-25 mg tablet 1 tablet Orally Once a day  verapamiL (VERELAN) 360 MG SR capsule Take 1 capsule by mouth once daily   No current facility-administered medications on file prior to visit.   Family History  Problem Relation Age of Onset  Stroke Mother  High blood pressure (Hypertension)  Mother  Coronary Artery Disease (Blocked arteries around heart) Father  High blood pressure (Hypertension) Sister  Diabetes Sister    Social History   Tobacco Use  Smoking Status Former  Types: Cigarettes  Start date: 2015  Smokeless Tobacco Never    Social History   Socioeconomic History  Marital status: Married  Tobacco Use  Smoking status: Former  Types: Cigarettes  Start date: 2015  Smokeless tobacco: Never  Substance and Sexual Activity  Alcohol use: Yes  Drug use: Never   Objective:   Vitals:  BP: (!) 151/90  Pulse: 103  Temp: 36.4 C (97.6 F)  SpO2: 96%  Weight: 94.9 kg (209 lb 3.2 oz)  Height: 162.6 cm (5' 4)  PainSc: 2  PainLoc: Breast   Body mass index is 35.91 kg/m.  Physical Exam Vitals reviewed.  Constitutional:  General: She is not in acute distress. Appearance: Normal appearance.  HENT:  Head: Normocephalic and atraumatic.  Right Ear: External ear normal.  Left Ear: External ear normal.  Nose: Nose normal.  Mouth/Throat:  Mouth: Mucous membranes are moist.  Pharynx: Oropharynx is clear.  Eyes:  General: No scleral icterus. Extraocular Movements: Extraocular movements intact.  Conjunctiva/sclera: Conjunctivae normal.  Pupils: Pupils are equal, round, and reactive to light.  Cardiovascular:  Rate and Rhythm: Normal rate and regular rhythm.  Pulses: Normal pulses.  Heart sounds: Normal heart sounds.  Pulmonary:  Effort: Pulmonary effort is normal. No respiratory distress.  Breath sounds: Normal breath sounds.  Abdominal:  General: Bowel sounds are normal.  Palpations: Abdomen is soft.  Tenderness: There  is no abdominal tenderness.  Musculoskeletal:  General: No swelling, tenderness or deformity. Normal range of motion.  Cervical back: Normal range of motion and neck supple.  Skin: General: Skin is warm and dry.  Coloration: Skin is not jaundiced.  Neurological:  General: No focal deficit present.  Mental Status: She is  alert and oriented to person, place, and time.  Psychiatric:  Mood and Affect: Mood normal.  Behavior: Behavior normal.     Breast: There is no palpable mass in either breast. There is no palpable axillary, supraclavicular, or cervical lymphadenopathy. I can express fluid from the right nipple  Labs, Imaging and Diagnostic Testing:  Assessment and Plan:   Diagnoses and all orders for this visit:  Bloody discharge from right nipple - [facility] Case Posting Request; Future   The patient appears to have bloody nipple discharge from the right breast with a 1 cm cyst in the retroareolar right breast that may be the source. Because of this I feel it would be reasonable to remove the cyst to make sure there is nothing else going on and to try to stop the bloody discharge. She would also like to have this done. I have discussed with her in detail the risks and benefits of the operation as well as some of the technical aspects and she understands and wishes to proceed. We will move forward with surgical scheduling.

## 2023-07-13 NOTE — Op Note (Signed)
 07/13/2023  11:48 AM  PATIENT:  Meghan Ramirez  63 y.o. female  PRE-OPERATIVE DIAGNOSIS:  RIGHT NIPPLE DISCHARGE BLOODY  POST-OPERATIVE DIAGNOSIS:  RIGHT NIPPLE DISCHARGE BLOODY  PROCEDURE:  Procedure(s): RIGHT BREAST SUBAREOLAR DUCT EXCISION (Right)  SURGEON:  Surgeons and Role:    * Curvin Deward MOULD, MD - Primary  PHYSICIAN ASSISTANT:   ASSISTANTS: none   ANESTHESIA:   local and general  EBL:  minimal   BLOOD ADMINISTERED:none  DRAINS: none   LOCAL MEDICATIONS USED:  MARCAINE      SPECIMEN:  Source of Specimen:  right breast subareolar tissue  DISPOSITION OF SPECIMEN:  PATHOLOGY  COUNTS:  YES  TOURNIQUET:  * No tourniquets in log *  DICTATION: .Dragon Dictation  After informed consent was obtained the patient was brought to the operating room and placed in the supine position on the operating room table.  After adequate induction of general anesthesia the patient's right breast was prepped with ChloraPrep, allowed to dry, and draped in usual sterile manner.  An appropriate timeout was performed.  The breast was compressed and there was bloody nipple discharge coming from the central portion of the nipple.  I was able to cannulate the duct with a small silver probe.  The area around the nipple and areola was then infiltrated with quarter percent Marcaine .  A curvilinear incision was made with a 15 blade knife along the outer edge of the areola.  The incision was carried through the skin and subcutaneous tissue sharply with the electrocautery.  Dissection was then carried out beneath the nipple and areola shallow behind the nipple.  I was able to identify the duct that had the probe in it and incorporate this into the excised tissue.  Once this was accomplished in the subareolar tissue from the right breast was removed from the patient.  The tissue was oriented with the appropriate paint colors.  The tissue was then sent to pathology for further evaluation.  Hemostasis was  achieved using the Bovie electrocautery.  The ductal tissue in the breast and behind the nipple was also fulgurated with cautery.  The area deep to the lumpectomy cavity was then infiltrated with more quarter percent Marcaine .  The lumpectomy cavity was then closed with interrupted 3-0 Vicryl stitches.  The skin was closed with interrupted 4-0 Monocryl subcuticular stitches.  Dermabond dressings were applied.  The patient tolerated the procedure well.  At the end of the case all needle sponge and instrument counts were correct.  The patient was then awakened and taken to recovery in stable condition.  PLAN OF CARE: Discharge to home after PACU  PATIENT DISPOSITION:  PACU - hemodynamically stable.   Delay start of Pharmacological VTE agent (>24hrs) due to surgical blood loss or risk of bleeding: not applicable

## 2023-07-13 NOTE — Discharge Instructions (Signed)
 No tylenol  until 3:45 p.m.   Post Anesthesia Home Care Instructions  Activity: Get plenty of rest for the remainder of the day. A responsible individual must stay with you for 24 hours following the procedure.  For the next 24 hours, DO NOT: -Drive a car -Advertising copywriter -Drink alcoholic beverages -Take any medication unless instructed by your physician -Make any legal decisions or sign important papers.  Meals: Start with liquid foods such as gelatin or soup. Progress to regular foods as tolerated. Avoid greasy, spicy, heavy foods. If nausea and/or vomiting occur, drink only clear liquids until the nausea and/or vomiting subsides. Call your physician if vomiting continues.  Special Instructions/Symptoms: Your throat may feel dry or sore from the anesthesia or the breathing tube placed in your throat during surgery. If this causes discomfort, gargle with warm salt water. The discomfort should disappear within 24 hours.  If you had a scopolamine patch placed behind your ear for the management of post- operative nausea and/or vomiting:  1. The medication in the patch is effective for 72 hours, after which it should be removed.  Wrap patch in a tissue and discard in the trash. Wash hands thoroughly with soap and water. 2. You may remove the patch earlier than 72 hours if you experience unpleasant side effects which may include dry mouth, dizziness or visual disturbances. 3. Avoid touching the patch. Wash your hands with soap and water after contact with the patch.

## 2023-07-13 NOTE — Anesthesia Preprocedure Evaluation (Signed)
Anesthesia Evaluation  Patient identified by MRN, date of birth, ID band Patient awake    Reviewed: Allergy & Precautions, H&P , NPO status , Patient's Chart, lab work & pertinent test results  Airway Mallampati: II   Neck ROM: full    Dental   Pulmonary neg pulmonary ROS   breath sounds clear to auscultation       Cardiovascular hypertension,  Rhythm:regular Rate:Normal     Neuro/Psych    GI/Hepatic   Endo/Other    Renal/GU      Musculoskeletal   Abdominal   Peds  Hematology   Anesthesia Other Findings   Reproductive/Obstetrics                             Anesthesia Physical Anesthesia Plan  ASA: 2  Anesthesia Plan: General   Post-op Pain Management:    Induction: Intravenous  PONV Risk Score and Plan: 3 and Ondansetron, Dexamethasone, Midazolam and Treatment may vary due to age or medical condition  Airway Management Planned: LMA  Additional Equipment:   Intra-op Plan:   Post-operative Plan: Extubation in OR  Informed Consent: I have reviewed the patients History and Physical, chart, labs and discussed the procedure including the risks, benefits and alternatives for the proposed anesthesia with the patient or authorized representative who has indicated his/her understanding and acceptance.     Dental advisory given  Plan Discussed with: CRNA, Anesthesiologist and Surgeon  Anesthesia Plan Comments:        Anesthesia Quick Evaluation

## 2023-07-13 NOTE — Anesthesia Procedure Notes (Signed)
 Procedure Name: LMA Insertion Date/Time: 07/13/2023 11:20 AM  Performed by: Casimir Lucie HERO, CRNAPre-anesthesia Checklist: Patient identified, Emergency Drugs available, Suction available and Patient being monitored Patient Re-evaluated:Patient Re-evaluated prior to induction Oxygen Delivery Method: Circle system utilized Preoxygenation: Pre-oxygenation with 100% oxygen Induction Type: IV induction Ventilation: Mask ventilation without difficulty LMA: LMA inserted LMA Size: 4.0 Number of attempts: 1 Airway Equipment and Method: Bite block Placement Confirmation: positive ETCO2 Tube secured with: Tape Dental Injury: Teeth and Oropharynx as per pre-operative assessment

## 2023-07-13 NOTE — Interval H&P Note (Signed)
 History and Physical Interval Note:  07/13/2023 11:00 AM  Meghan Ramirez  has presented today for surgery, with the diagnosis of RIGHT NIPPLE DISCHARGE.  The various methods of treatment have been discussed with the patient and family. After consideration of risks, benefits and other options for treatment, the patient has consented to  Procedure(s): RIGHT BREAST SUBAREOLAR DUCT EXCISION (Right) as a surgical intervention.  The patient's history has been reviewed, patient examined, no change in status, stable for surgery.  I have reviewed the patient's chart and labs.  Questions were answered to the patient's satisfaction.     Deward Null III

## 2023-07-13 NOTE — Transfer of Care (Signed)
 Immediate Anesthesia Transfer of Care Note  Patient: Meghan Ramirez  Procedure(s) Performed: RIGHT BREAST SUBAREOLAR DUCT EXCISION (Right: Breast)  Patient Location: PACU  Anesthesia Type:General  Level of Consciousness: drowsy and patient cooperative  Airway & Oxygen Therapy: Patient Spontanous Breathing and Patient connected to face mask oxygen  Post-op Assessment: Report given to RN and Post -op Vital signs reviewed and stable  Post vital signs: Reviewed and stable  Last Vitals:  Vitals Value Taken Time  BP 109/65   Temp    Pulse 62 07/13/23 1155  Resp 11 07/13/23 1155  SpO2 98 % 07/13/23 1155  Vitals shown include unfiled device data.  Last Pain:  Vitals:   07/13/23 0938  TempSrc: Temporal  PainSc: 0-No pain         Complications: No notable events documented.

## 2023-07-14 NOTE — Anesthesia Postprocedure Evaluation (Signed)
 Anesthesia Post Note  Patient: Meghan Ramirez  Procedure(s) Performed: RIGHT BREAST SUBAREOLAR DUCT EXCISION (Right: Breast)     Patient location during evaluation: PACU Anesthesia Type: General Level of consciousness: awake and alert Pain management: pain level controlled Vital Signs Assessment: post-procedure vital signs reviewed and stable Respiratory status: spontaneous breathing, nonlabored ventilation, respiratory function stable and patient connected to nasal cannula oxygen Cardiovascular status: blood pressure returned to baseline and stable Postop Assessment: no apparent nausea or vomiting Anesthetic complications: no   No notable events documented.  Last Vitals:  Vitals:   07/13/23 1245 07/13/23 1255  BP: 118/69 125/79  Pulse: 64 73  Resp: 13 18  Temp:  (!) 36.2 C  SpO2: 94% 96%    Last Pain:  Vitals:   07/13/23 1255  TempSrc:   PainSc: 2                  Ceriah Kohler S

## 2023-07-15 ENCOUNTER — Encounter (HOSPITAL_BASED_OUTPATIENT_CLINIC_OR_DEPARTMENT_OTHER): Payer: Self-pay | Admitting: General Surgery

## 2023-07-17 LAB — SURGICAL PATHOLOGY

## 2023-07-22 ENCOUNTER — Other Ambulatory Visit: Payer: BC Managed Care – PPO

## 2024-01-02 ENCOUNTER — Other Ambulatory Visit: Payer: Self-pay | Admitting: Orthopaedic Surgery

## 2024-01-02 DIAGNOSIS — M5412 Radiculopathy, cervical region: Secondary | ICD-10-CM

## 2024-01-18 ENCOUNTER — Inpatient Hospital Stay: Admission: RE | Admit: 2024-01-18 | Source: Ambulatory Visit

## 2024-01-21 ENCOUNTER — Ambulatory Visit
Admission: RE | Admit: 2024-01-21 | Discharge: 2024-01-21 | Disposition: A | Discharge status: Home / Self Care | Source: Ambulatory Visit | Attending: Orthopaedic Surgery

## 2024-01-21 DIAGNOSIS — M5412 Radiculopathy, cervical region: Secondary | ICD-10-CM

## 2024-08-06 ENCOUNTER — Encounter: Payer: Self-pay | Admitting: Physician Assistant

## 2024-08-06 DIAGNOSIS — Z1231 Encounter for screening mammogram for malignant neoplasm of breast: Secondary | ICD-10-CM

## 2024-08-08 ENCOUNTER — Other Ambulatory Visit: Payer: Self-pay | Admitting: Physician Assistant

## 2024-08-08 DIAGNOSIS — N644 Mastodynia: Secondary | ICD-10-CM

## 2024-08-26 ENCOUNTER — Encounter

## 2024-08-26 ENCOUNTER — Other Ambulatory Visit
